# Patient Record
Sex: Female | Born: 1965 | Race: Black or African American | Hispanic: No | Marital: Married | Smoking: Never smoker
Health system: Southern US, Community
[De-identification: ages and names within clinical notes are randomized; demographics above are authoritative.]

## PROBLEM LIST (undated history)

## (undated) HISTORY — PX: FISSURECTOMY: SHX5244

## (undated) HISTORY — PX: CHOLECYSTECTOMY: SHX55

## (undated) HISTORY — PX: TUBAL LIGATION: SHX77

## (undated) HISTORY — PX: BREAST CYST EXCISION: SHX579

## (undated) HISTORY — PX: ECTOPIC PREGNANCY SURGERY: SHX613

---

## 2009-12-16 ENCOUNTER — Emergency Department (HOSPITAL_BASED_OUTPATIENT_CLINIC_OR_DEPARTMENT_OTHER): Admission: EM | Admit: 2009-12-16 | Discharge: 2009-12-16 | Payer: Self-pay | Admitting: Emergency Medicine

## 2010-02-07 ENCOUNTER — Emergency Department (HOSPITAL_BASED_OUTPATIENT_CLINIC_OR_DEPARTMENT_OTHER): Admission: EM | Admit: 2010-02-07 | Discharge: 2010-02-07 | Payer: Self-pay | Admitting: Emergency Medicine

## 2010-02-28 ENCOUNTER — Emergency Department (HOSPITAL_BASED_OUTPATIENT_CLINIC_OR_DEPARTMENT_OTHER): Admission: EM | Admit: 2010-02-28 | Discharge: 2010-02-28 | Payer: Self-pay | Admitting: Emergency Medicine

## 2010-02-28 ENCOUNTER — Ambulatory Visit: Payer: Self-pay | Admitting: Radiology

## 2010-12-19 ENCOUNTER — Emergency Department (HOSPITAL_BASED_OUTPATIENT_CLINIC_OR_DEPARTMENT_OTHER)
Admission: EM | Admit: 2010-12-19 | Discharge: 2010-12-19 | Disposition: A | Discharge status: Home / Self Care | Payer: Medicaid Other | Attending: Emergency Medicine | Admitting: Emergency Medicine

## 2010-12-19 DIAGNOSIS — J029 Acute pharyngitis, unspecified: Secondary | ICD-10-CM | POA: Insufficient documentation

## 2010-12-19 DIAGNOSIS — F17200 Nicotine dependence, unspecified, uncomplicated: Secondary | ICD-10-CM | POA: Insufficient documentation

## 2010-12-19 DIAGNOSIS — M62838 Other muscle spasm: Secondary | ICD-10-CM | POA: Insufficient documentation

## 2011-08-22 ENCOUNTER — Encounter (HOSPITAL_BASED_OUTPATIENT_CLINIC_OR_DEPARTMENT_OTHER): Payer: Self-pay | Admitting: *Deleted

## 2011-08-22 ENCOUNTER — Emergency Department (HOSPITAL_BASED_OUTPATIENT_CLINIC_OR_DEPARTMENT_OTHER)
Admission: EM | Admit: 2011-08-22 | Discharge: 2011-08-22 | Disposition: A | Discharge status: Home / Self Care | Payer: Self-pay | Attending: Emergency Medicine | Admitting: Emergency Medicine

## 2011-08-22 DIAGNOSIS — Y93G1 Activity, food preparation and clean up: Secondary | ICD-10-CM | POA: Insufficient documentation

## 2011-08-22 DIAGNOSIS — W268XXA Contact with other sharp object(s), not elsewhere classified, initial encounter: Secondary | ICD-10-CM | POA: Insufficient documentation

## 2011-08-22 DIAGNOSIS — S61209A Unspecified open wound of unspecified finger without damage to nail, initial encounter: Secondary | ICD-10-CM | POA: Insufficient documentation

## 2011-08-22 MED ORDER — TETANUS-DIPHTH-ACELL PERTUSSIS 5-2.5-18.5 LF-MCG/0.5 IM SUSP
0.5000 mL | Freq: Once | INTRAMUSCULAR | Status: AC
  Administered 2011-08-22: 0.5 mL via INTRAMUSCULAR
  Filled 2011-08-22: qty 0.5

## 2011-08-22 MED ORDER — OXYCODONE-ACETAMINOPHEN 5-325 MG PO TABS
2.0000 | ORAL_TABLET | Freq: Once | ORAL | Status: AC
  Administered 2011-08-22: 2 via ORAL
  Filled 2011-08-22: qty 2

## 2011-08-22 MED ORDER — OXYCODONE-ACETAMINOPHEN 5-325 MG PO TABS: 1.0000 | ORAL_TABLET | ORAL | Status: AC | PRN

## 2011-08-22 NOTE — ED Provider Notes (Signed)
History     CSN: [id number]  Arrival date & time 08/22/11  1020   First MD Initiated Contact with Patient 08/22/11 1044      Chief Complaint  Patient presents with  . Laceration    right fifth finger    (Consider location/radiation/quality/duration/timing/severity/associated sxs/prior treatment) HPI  Patient injured her right pinky finger last night with a potato peeler. She's continued to have some bleeding in this area. It is painful. The nail is intact. Her last tetanus shot was approximately 10 years ago.  History reviewed. No pertinent past medical history.  Past Surgical History  Procedure Date  . Tubal ligation   . Fissurectomy   . Breast cyst excision   . Ectopic pregnancy surgery   . Cholecystectomy     No family history on file.  History  Substance Use Topics  . Smoking status: Never Smoker   . Smokeless tobacco: Not on file  . Alcohol Use: Yes     occassionally    OB History    Grav Para Term Preterm Abortions TAB SAB Ect Mult Living                  Review of Systems  All other systems reviewed and are negative.    Allergies  Review of patient's allergies indicates no known allergies.  Home Medications  No current outpatient prescriptions on file.  BP 99/72  Pulse 91  Temp(Src) 97.8 F (36.6 C) (Oral)  Resp 18  Ht 5\' 6"  (1.676 m)  Wt 187 lb (84.823 kg)  BMI 30.18 kg/m2  SpO2 96%  LMP 08/12/2011  Physical Exam  Nursing note and vitals reviewed. Constitutional: She is oriented to person, place, and time. She appears well-developed and well-nourished.  HENT:  Head: Normocephalic and atraumatic.  Eyes: Pupils are equal, round, and reactive to light.  Neck: Thyromegaly present.  Musculoskeletal:       Hands:      Sensation intact. Full active range of motion. Nail intact.  Neurological: She is alert and oriented to person, place, and time.  Skin: Skin is warm.  Psychiatric: She has a normal mood and affect.    ED Course    Procedures (including critical care time)  Labs Reviewed - No data to display No results found.   No diagnosis found.    MDM  Patient is having wound cleansed       Hilario Quarry, MD 08/23/11 1243

## 2011-08-22 NOTE — Discharge Instructions (Signed)
Fingertip Laceration  The treatment of fingertip injuries depends on how large the cut is and whether the bone or nail tissue has been damaged. Amputations of the skin over the tip of the finger that is smaller than a dime (smaller than 1cm) will usually heal very well from the sides without any treatment other than cleaning the wound and changing the dressing.  Keep your hand elevated for the next 2 to 3 days to reduce pain and swelling. A splint over the fingertip may be needed to protect your injury. If your cut is being allowed to heal in from the sides, you should soak it in warm water and change the dressing daily.   You may need a tetanus shot if:  · You cannot remember when you had your last tetanus shot.   · You have never had a tetanus shot.   · The injury broke your skin.   If you got a tetanus shot, your arm may swell, get red, and feel warm to the touch. This is common and not a problem. If you need a tetanus shot and you choose not to have one, there is a rare chance of getting tetanus. Sickness from tetanus can be serious.  SEEK MEDICAL CARE IF:   · There are any signs of infection: increased redness, swelling, and pain, or sometimes pus drainage.   Document Released: 07/26/2004 Document Revised: 02/28/2011 Document Reviewed: 07/22/2008  Exit[employer]® Patient Information ©2012 [employer].

## 2011-08-22 NOTE — ED Notes (Signed)
Patient states she was cutting fries last night and sliced the pad off of her right fifth finger.

## 2011-10-07 ENCOUNTER — Emergency Department (HOSPITAL_BASED_OUTPATIENT_CLINIC_OR_DEPARTMENT_OTHER)
Admission: EM | Admit: 2011-10-07 | Discharge: 2011-10-07 | Disposition: A | Discharge status: Home / Self Care | Payer: Self-pay | Attending: Emergency Medicine | Admitting: Emergency Medicine

## 2011-10-07 ENCOUNTER — Encounter (HOSPITAL_BASED_OUTPATIENT_CLINIC_OR_DEPARTMENT_OTHER): Payer: Self-pay | Admitting: *Deleted

## 2011-10-07 DIAGNOSIS — J020 Streptococcal pharyngitis: Secondary | ICD-10-CM | POA: Insufficient documentation

## 2011-10-07 MED ORDER — LIDOCAINE HCL (PF) 1 % IJ SOLN
INTRAMUSCULAR | Status: AC
  Filled 2011-10-07: qty 5

## 2011-10-07 MED ORDER — PENICILLIN G BENZATHINE 1200000 UNIT/2ML IM SUSP
1.2000 10*6.[IU] | Freq: Once | INTRAMUSCULAR | Status: AC
  Administered 2011-10-07: 1.2 10*6.[IU] via INTRAMUSCULAR
  Filled 2011-10-07: qty 2

## 2011-10-07 MED ORDER — CEFTRIAXONE SODIUM 1 G IJ SOLR
1.0000 g | Freq: Once | INTRAMUSCULAR | Status: AC
  Administered 2011-10-07: 1 g via INTRAMUSCULAR

## 2011-10-07 MED ORDER — CEFTRIAXONE SODIUM 1 G IJ SOLR
1.0000 g | Freq: Once | INTRAMUSCULAR | Status: DC
  Filled 2011-10-07: qty 10

## 2011-10-07 MED ORDER — PENICILLIN V POTASSIUM 500 MG PO TABS: 500.0000 mg | ORAL_TABLET | Freq: Four times a day (QID) | ORAL | Status: DC

## 2011-10-07 MED ORDER — METHYLPREDNISOLONE SODIUM SUCC 125 MG IJ SOLR
125.0000 mg | Freq: Once | INTRAMUSCULAR | Status: AC
  Administered 2011-10-07: 125 mg via INTRAMUSCULAR
  Filled 2011-10-07: qty 2

## 2011-10-07 MED ORDER — HYDROCODONE-ACETAMINOPHEN 7.5-500 MG/15ML PO SOLN: 15.0000 mL | Freq: Four times a day (QID) | ORAL | Status: AC | PRN

## 2011-10-07 MED ORDER — CEFTRIAXONE SODIUM 250 MG IJ SOLR: 250.0000 mg | Freq: Once | INTRAMUSCULAR | Status: DC

## 2011-10-07 NOTE — ED Provider Notes (Signed)
Medical screening examination/treatment/procedure(s) were performed by non-physician practitioner and as supervising physician I was immediately available for consultation/collaboration.   Jyasia Markoff, MD 10/07/11 1514 

## 2011-10-07 NOTE — ED Notes (Signed)
Sore throat/ear ache since last week, hurts to swallow, some chills at night

## 2011-10-07 NOTE — Discharge Instructions (Signed)
Salt Water Gargle  This solution will help make your mouth and throat feel better.  HOME CARE INSTRUCTIONS     Mix 1 teaspoon of salt in 8 ounces of warm water.   Gargle with this solution as much or often as you need or as directed. Swish and gargle gently if you have any sores or wounds in your mouth.   Do not swallow this mixture.  Document Released: 03/22/2004 Document Revised: 06/07/2011 Document Reviewed: 08/13/2008  [employer] Patient Information 2012 [employer].    Strep Throat  Strep throat is an infection of the throat caused by a bacteria named Streptococcus pyogenes. Your caregiver may call the infection streptococcal "tonsillitis" or "pharyngitis" depending on whether there are signs of inflammation in the tonsils or back of the throat. Strep throat is most common in children from 5 to 15 years old during the cold months of the year, but it can occur in people of any age during any season. This infection is spread from person to person (contagious) through coughing, sneezing, or other close contact.  SYMPTOMS     Fever or chills.   Painful, swollen, red tonsils or throat.   Pain or difficulty when swallowing.   White or yellow spots on the tonsils or throat.   Swollen, tender lymph nodes or "glands" of the neck or under the jaw.   Red rash all over the body (rare).  DIAGNOSIS    Many different infections can cause the same symptoms. A test must be done to confirm the diagnosis so the right treatment can be given. A "rapid strep test" can help your caregiver make the diagnosis in a few minutes. If this test is not available, a light swab of the infected area can be used for a throat culture test. If a throat culture test is done, results are usually available in a day or two.  TREATMENT    Strep throat is treated with antibiotic medicine.  HOME CARE INSTRUCTIONS     Gargle with 1 tsp of salt in 1 cup of warm water, 3 to 4 times per day or as needed for comfort.    Family members who also have a sore throat or fever should be tested for strep throat and treated with antibiotics if they have the strep infection.   Make sure everyone in your household washes their hands well.   Do not share food, drinking cups, or personal items that could cause the infection to spread to others.   You may need to eat a soft food diet until your sore throat gets better.   Drink enough water and fluids to keep your urine clear or pale yellow. This will help prevent dehydration.   Get plenty of rest.   Stay home from school, daycare, or work until you have been on antibiotics for 24 hours.   Only take over-the-counter or prescription medicines for pain, discomfort, or fever as directed by your caregiver.   If antibiotics are prescribed, take them as directed. Finish them even if you start to feel better.  SEEK MEDICAL CARE IF:     The glands in your neck continue to enlarge.   You develop a rash, cough, or earache.   You cough up green, yellow-brown, or bloody sputum.   You have pain or discomfort not controlled by medicines.   Your problems seem to be getting worse rather than better.  SEEK IMMEDIATE MEDICAL CARE IF:     You develop any new symptoms such   as vomiting, severe headache, stiff or painful neck, chest pain, shortness of breath, or trouble swallowing.   You develop severe throat pain, drooling, or changes in your voice.   You develop swelling of the neck, or the skin on the neck becomes red and tender.   You have a fever.   You develop signs of dehydration, such as fatigue, dry mouth, and decreased urination.   You become increasingly sleepy, or you cannot wake up completely.  Document Released: 06/15/2000 Document Revised: 06/07/2011 Document Reviewed: 08/17/2010  [employer] Patient Information 2012 [employer].

## 2011-10-07 NOTE — ED Provider Notes (Signed)
History     CSN: [id number]  Arrival date & time 10/07/11  1153   First MD Initiated Contact with Patient 10/07/11 1208      Chief Complaint  Patient presents with  . Sore Throat    (Consider location/radiation/quality/duration/timing/severity/associated sxs/prior treatment) HPI  Patient presents to the ED with complaints of sore throat that started 1 week ago. She states that she had a bad throat infection 4 months ago and this feels a lot like that did. She also admits to bilateral ear pain. She says that the left side hurts worse than the right. She is able to swallow with pain. She is tolerating her secretions, her voices does not sound muffled and she has not had any difficulty breathing or weakness.   History reviewed. No pertinent past medical history.  Past Surgical History  Procedure Date  . Tubal ligation   . Fissurectomy   . Breast cyst excision   . Ectopic pregnancy surgery   . Cholecystectomy     No family history on file.  History  Substance Use Topics  . Smoking status: Never Smoker   . Smokeless tobacco: Not on file  . Alcohol Use: Yes     occassionally    OB History    Grav Para Term Preterm Abortions TAB SAB Ect Mult Living                  Review of Systems  All other systems reviewed and are negative.    Allergies  Review of patient's allergies indicates no known allergies.  Home Medications   Current Outpatient Rx  Name Route Sig Dispense Refill  . HYDROCODONE-ACETAMINOPHEN 7.5-500 MG/15ML PO SOLN Oral Take 15 mLs by mouth every 6 (six) hours as needed for pain. 120 mL 0  . PENICILLIN V POTASSIUM 500 MG PO TABS Oral Take 1 tablet (500 mg total) by mouth 4 (four) times daily. 40 tablet 0    BP 139/83  Pulse 96  Temp(Src) 98.7 F (37.1 C) (Oral)  Resp 20  SpO2 100%  Physical Exam  Nursing note and vitals reviewed. Constitutional: She appears well-developed and well-nourished. No distress.  HENT:  Head: Normocephalic and  atraumatic. No trismus in the jaw.  Right Ear: Hearing and tympanic membrane normal.  Left Ear: Hearing and tympanic membrane normal.  Nose: No rhinorrhea.  Mouth/Throat: Mucous membranes are normal. No uvula swelling. Oropharyngeal exudate and posterior oropharyngeal erythema present. No tonsillar abscesses.  Eyes: Pupils are equal, round, and reactive to light.  Neck: Normal range of motion. Neck supple.  Cardiovascular: Normal rate and regular rhythm.   Pulmonary/Chest: Effort normal.  Abdominal: Soft.  Neurological: She is alert.  Skin: Skin is warm and dry.    ED Course  Procedures (including critical care time)   Labs Reviewed  RAPID STREP SCREEN   No results found.   1. Strep pharyngitis       MDM  Pt given IM Bicillin and Solumedrol in ED. Also given Rx for Lortab elixir.   Pt referred to Dr.Rosen ENT for follow- up.  Pt has been advised of the symptoms that warrant their return to the ED. Patient has voiced understanding and has agreed to follow-up with the PCP or specialist.         Dorthula Matas, PA 10/07/11 1229  Dorthula Matas, PA 10/07/11 1251

## 2013-01-15 ENCOUNTER — Emergency Department (HOSPITAL_BASED_OUTPATIENT_CLINIC_OR_DEPARTMENT_OTHER)
Admission: EM | Admit: 2013-01-15 | Discharge: 2013-01-15 | Disposition: A | Discharge status: Home / Self Care | Payer: Self-pay | Attending: Emergency Medicine | Admitting: Emergency Medicine

## 2013-01-15 ENCOUNTER — Ambulatory Visit (HOSPITAL_BASED_OUTPATIENT_CLINIC_OR_DEPARTMENT_OTHER)
Admission: RE | Admit: 2013-01-15 | Discharge: 2013-01-15 | Disposition: A | Discharge status: Home / Self Care | Payer: Self-pay | Source: Ambulatory Visit | Attending: Emergency Medicine | Admitting: Emergency Medicine

## 2013-01-15 ENCOUNTER — Other Ambulatory Visit (HOSPITAL_BASED_OUTPATIENT_CLINIC_OR_DEPARTMENT_OTHER): Payer: Self-pay | Admitting: Emergency Medicine

## 2013-01-15 ENCOUNTER — Telehealth (HOSPITAL_BASED_OUTPATIENT_CLINIC_OR_DEPARTMENT_OTHER): Payer: Self-pay | Admitting: Physician Assistant

## 2013-01-15 ENCOUNTER — Encounter (HOSPITAL_BASED_OUTPATIENT_CLINIC_OR_DEPARTMENT_OTHER): Payer: Self-pay | Admitting: *Deleted

## 2013-01-15 DIAGNOSIS — Z9889 Other specified postprocedural states: Secondary | ICD-10-CM | POA: Insufficient documentation

## 2013-01-15 DIAGNOSIS — R110 Nausea: Secondary | ICD-10-CM | POA: Insufficient documentation

## 2013-01-15 DIAGNOSIS — N949 Unspecified condition associated with female genital organs and menstrual cycle: Secondary | ICD-10-CM | POA: Insufficient documentation

## 2013-01-15 DIAGNOSIS — R102 Pelvic and perineal pain: Secondary | ICD-10-CM

## 2013-01-15 DIAGNOSIS — D250 Submucous leiomyoma of uterus: Secondary | ICD-10-CM | POA: Insufficient documentation

## 2013-01-15 DIAGNOSIS — Z9089 Acquired absence of other organs: Secondary | ICD-10-CM | POA: Insufficient documentation

## 2013-01-15 DIAGNOSIS — Z3202 Encounter for pregnancy test, result negative: Secondary | ICD-10-CM | POA: Insufficient documentation

## 2013-01-15 DIAGNOSIS — Z9851 Tubal ligation status: Secondary | ICD-10-CM | POA: Insufficient documentation

## 2013-01-15 DIAGNOSIS — N83209 Unspecified ovarian cyst, unspecified side: Secondary | ICD-10-CM | POA: Insufficient documentation

## 2013-01-15 DIAGNOSIS — N940 Mittelschmerz: Secondary | ICD-10-CM

## 2013-01-15 LAB — WET PREP, GENITAL
Trich, Wet Prep: NONE SEEN
Yeast Wet Prep HPF POC: NONE SEEN

## 2013-01-15 LAB — GC/CHLAMYDIA PROBE AMP
CT Probe RNA: NEGATIVE
GC Probe RNA: NEGATIVE

## 2013-01-15 LAB — URINALYSIS, ROUTINE W REFLEX MICROSCOPIC
Bilirubin Urine: NEGATIVE
Glucose, UA: NEGATIVE mg/dL
Hgb urine dipstick: NEGATIVE
Leukocytes, UA: NEGATIVE
Nitrite: NEGATIVE

## 2013-01-15 MED ORDER — HYDROCODONE-ACETAMINOPHEN 5-325 MG PO TABS: 1.0000 | ORAL_TABLET | Freq: Four times a day (QID) | ORAL | Status: DC | PRN

## 2013-01-15 MED ORDER — NAPROXEN 250 MG PO TABS
ORAL_TABLET | ORAL | Status: AC
  Filled 2013-01-15: qty 2

## 2013-01-15 MED ORDER — ONDANSETRON 8 MG PO TBDP: 8.0000 mg | ORAL_TABLET | Freq: Three times a day (TID) | ORAL | Status: DC | PRN

## 2013-01-15 MED ORDER — NAPROXEN 250 MG PO TABS
500.0000 mg | ORAL_TABLET | Freq: Once | ORAL | Status: AC
  Administered 2013-01-15: 500 mg via ORAL
  Filled 2013-01-15: qty 1

## 2013-01-15 MED ORDER — HYDROCODONE-ACETAMINOPHEN 5-325 MG PO TABS
1.0000 | ORAL_TABLET | Freq: Once | ORAL | Status: AC
  Administered 2013-01-15: 1 via ORAL
  Filled 2013-01-15: qty 1

## 2013-01-15 MED ORDER — ONDANSETRON 4 MG PO TBDP
4.0000 mg | ORAL_TABLET | Freq: Once | ORAL | Status: AC
  Administered 2013-01-15: 4 mg via ORAL
  Filled 2013-01-15: qty 1

## 2013-01-15 NOTE — ED Provider Notes (Signed)
History    CSN: [id number] Arrival date & time 01/15/13  0005  First MD Initiated Contact with Patient 01/15/13 0148     Chief Complaint  Patient presents with  . Abdominal Pain   (Consider location/radiation/quality/duration/timing/severity/associated sxs/prior Treatment) HPI 47 year old female who was having sexual intercourse about 11 PM yesterday when she developed the sudden onset of a sharp, crampy, severe pain in her suprapubic region. There was associated nausea but no vomiting. She denies fever, chills, dysuria, hematuria, vaginal bleeding or vaginal discharge. The pain is improved somewhat and is now described as an 8-1/2 out of 10. The pain is worse with movement or palpation. She is about halfway through her menstrual cycle.  History reviewed. No pertinent past medical history. Past Surgical History  Procedure Laterality Date  . Tubal ligation    . Fissurectomy    . Breast cyst excision    . Ectopic pregnancy surgery    . Cholecystectomy     History reviewed. No pertinent family history. History  Substance Use Topics  . Smoking status: Never Smoker   . Smokeless tobacco: Not on file  . Alcohol Use: No     Comment: occassionally   OB History   Grav Para Term Preterm Abortions TAB SAB Ect Mult Living                 Review of Systems  All other systems reviewed and are negative.    Allergies  Review of patient's allergies indicates no known allergies.  Home Medications  No current outpatient prescriptions on file. BP 107/59  Pulse 71  Temp(Src) 98.1 F (36.7 C) (Oral)  Resp 16  Ht 5\' 6"  (1.676 m)  Wt 183 lb (83.008 kg)  BMI 29.55 kg/m2  SpO2 100%  LMP 12/30/2012  Physical Exam General: Well-developed, well-nourished female in no acute distress; appearance consistent with age of record HENT: normocephalic, atraumatic Eyes: pupils equal round and reactive to light; extraocular muscles intact Neck: supple Heart: regular rate and rhythm Lungs:  clear to auscultation bilaterally Abdomen: soft; nondistended; mild diffuse tenderness with moderate suprapubic tenderness; no masses or hepatosplenomegaly; bowel sounds present GU: Normal external genitalia; vaginal bleeding; physiologic appearing vaginal discharge; cervical motion tenderness pain felt anteriorly; mild bilateral adnexal tenderness Extremities: No deformity; full range of motion; pulses normal Neurologic: Awake, alert and oriented; motor function intact in all extremities and symmetric; no facial droop Skin: Warm and dry Psychiatric: Normal mood and affect    ED Course  Procedures (including critical care time)   MDM   Nursing notes and vitals signs, including pulse oximetry, reviewed.  Summary of this visit's results, reviewed by myself:  Labs:  Results for orders placed during the hospital encounter of 01/15/13 (from the past 24 hour(s))  URINALYSIS, ROUTINE W REFLEX MICROSCOPIC     Status: Abnormal   Collection Time    01/15/13 12:29 AM      Result Value Range   Color, Urine YELLOW  YELLOW   APPearance CLOUDY (*) CLEAR   Specific Gravity, Urine 1.024  1.005 - 1.030   pH 6.0  5.0 - 8.0   Glucose, UA NEGATIVE  NEGATIVE mg/dL   Hgb urine dipstick NEGATIVE  NEGATIVE   Bilirubin Urine NEGATIVE  NEGATIVE   Ketones, ur NEGATIVE  NEGATIVE mg/dL   Protein, ur NEGATIVE  NEGATIVE mg/dL   Urobilinogen, UA 1.0  0.0 - 1.0 mg/dL   Nitrite NEGATIVE  NEGATIVE   Leukocytes, UA NEGATIVE  NEGATIVE  PREGNANCY, URINE  Status: None   Collection Time    01/15/13 12:29 AM      Result Value Range   Preg Test, Ur NEGATIVE  NEGATIVE  WET PREP, GENITAL     Status: Abnormal   Collection Time    01/15/13  2:10 AM      Result Value Range   Yeast Wet Prep HPF POC NONE SEEN  NONE SEEN   Trich, Wet Prep NONE SEEN  NONE SEEN   Clue Cells Wet Prep HPF POC FEW (*) NONE SEEN   WBC, Wet Prep HPF POC MODERATE (*) NONE SEEN   2:29 AM Given sudden onset of pain and maximum severity  of onset this likely represents mittelschmerz or ruptured ovarian cyst. We will treat her pain and have her return for ultrasound later today.   Carlisle Beers Axten Pascucci, MD 01/15/13 0230

## 2013-01-15 NOTE — ED Notes (Signed)
MD at bedside. 

## 2013-01-15 NOTE — ED Notes (Signed)
Pt reported to triage nurse that "it hurts down into my vagina"

## 2013-01-15 NOTE — ED Notes (Addendum)
Pt c/o lower abd pain radiates  Into vagina , x 1 hr ago, nausea only

## 2013-02-11 ENCOUNTER — Encounter: Payer: Medicaid Other | Admitting: Obstetrics & Gynecology

## 2013-04-05 ENCOUNTER — Encounter (HOSPITAL_BASED_OUTPATIENT_CLINIC_OR_DEPARTMENT_OTHER): Payer: Self-pay | Admitting: Emergency Medicine

## 2013-04-05 ENCOUNTER — Emergency Department (HOSPITAL_BASED_OUTPATIENT_CLINIC_OR_DEPARTMENT_OTHER)
Admission: EM | Admit: 2013-04-05 | Discharge: 2013-04-05 | Disposition: A | Discharge status: Home / Self Care | Payer: BC Managed Care – PPO | Attending: Emergency Medicine | Admitting: Emergency Medicine

## 2013-04-05 DIAGNOSIS — Z9851 Tubal ligation status: Secondary | ICD-10-CM | POA: Insufficient documentation

## 2013-04-05 DIAGNOSIS — Z9889 Other specified postprocedural states: Secondary | ICD-10-CM | POA: Insufficient documentation

## 2013-04-05 DIAGNOSIS — Z3202 Encounter for pregnancy test, result negative: Secondary | ICD-10-CM | POA: Insufficient documentation

## 2013-04-05 DIAGNOSIS — K432 Incisional hernia without obstruction or gangrene: Secondary | ICD-10-CM | POA: Insufficient documentation

## 2013-04-05 LAB — URINALYSIS, ROUTINE W REFLEX MICROSCOPIC
Ketones, ur: 15 mg/dL — AB
Leukocytes, UA: NEGATIVE
Nitrite: NEGATIVE
Protein, ur: NEGATIVE mg/dL
Specific Gravity, Urine: 1.018 (ref 1.005–1.030)
Urobilinogen, UA: 1 mg/dL (ref 0.0–1.0)

## 2013-04-05 MED ORDER — TRAMADOL HCL 50 MG PO TABS: 50.0000 mg | ORAL_TABLET | Freq: Four times a day (QID) | ORAL | Status: AC | PRN

## 2013-04-05 NOTE — ED Notes (Signed)
Upper abdominal pain since last night.  No N/V/D.  No known fever or sob.

## 2013-04-05 NOTE — ED Provider Notes (Signed)
CSN: [id number]     Arrival date & time 04/05/13  2952 History   First MD Initiated Contact with Patient 04/05/13 1002     Chief Complaint  Patient presents with  . Abdominal Pain    HPI  Simply presents with a tender "lump" in her epigastric abdomen. She noticed it yesterday. She states her back was sore she was on the floor and stretching she noticed it was tender she states when she pushes on it is point tender right paracentral epigastrium underneath her incision from her laparoscopic cholecystectomy.  She is not nauseated. She shows good appetite and that she thinks states "I'm starving". No vomiting normal bowel movements no erythematous skin  History reviewed. No pertinent past medical history. Past Surgical History  Procedure Laterality Date  . Tubal ligation    . Fissurectomy    . Breast cyst excision    . Ectopic pregnancy surgery    . Cholecystectomy     History reviewed. No pertinent family history. History  Substance Use Topics  . Smoking status: Never Smoker   . Smokeless tobacco: Not on file  . Alcohol Use: No     Comment: occassionally   OB History   Grav Para Term Preterm Abortions TAB SAB Ect Mult Living                 Review of Systems  Constitutional: Negative for fever, chills, diaphoresis, appetite change and fatigue.  HENT: Negative for sore throat, mouth sores and trouble swallowing.   Eyes: Negative for visual disturbance.  Respiratory: Negative for cough, chest tightness, shortness of breath and wheezing.   Cardiovascular: Negative for chest pain.  Gastrointestinal: Positive for abdominal pain. Negative for nausea, vomiting, diarrhea and abdominal distention.       Palpable mass in the epigastrium  Endocrine: Negative for polydipsia, polyphagia and polyuria.  Genitourinary: Negative for dysuria, frequency and hematuria.  Musculoskeletal: Negative for gait problem.  Skin: Negative for color change, pallor and rash.  Neurological: Negative for  dizziness, syncope, light-headedness and headaches.  Hematological: Does not bruise/bleed easily.  Psychiatric/Behavioral: Negative for behavioral problems and confusion.    Allergies  Review of patient's allergies indicates no known allergies.  Home Medications   Current Outpatient Rx  Name  Route  Sig  Dispense  Refill  . traMADol (ULTRAM) 50 MG tablet   Oral   Take 1 tablet (50 mg total) by mouth every 6 (six) hours as needed for pain.   15 tablet   0    BP 124/75  Pulse 65  Temp(Src) 97.6 F (36.4 C) (Oral)  Resp 16  Ht 5\' 6"  (1.676 m)  Wt 168 lb (76.204 kg)  BMI 27.13 kg/m2  SpO2 100%  LMP 03/25/2013 Physical Exam  Constitutional: She is oriented to person, place, and time. She appears well-developed and well-nourished. No distress.  HENT:  Head: Normocephalic.  Eyes: Conjunctivae are normal. Pupils are equal, round, and reactive to light. No scleral icterus.  Neck: Normal range of motion. Neck supple. No thyromegaly present.  Cardiovascular: Normal rate and regular rhythm.  Exam reveals no gallop and no friction rub.   No murmur heard. Pulmonary/Chest: Effort normal and breath sounds normal. No respiratory distress. She has no wheezes. She has no rales.  Abdominal: Soft. Bowel sounds are normal. She exhibits no distension. There is no tenderness. There is no rebound.  She has a palpable incisional hernia underlying her superior midline laparoscopic incision. It is tender. It does reduce. She  states somewhat sore afterwards. Her abdomen is otherwise soft and benign. She is on arrival quadrant tenderness patient normal active bowel sounds  Musculoskeletal: Normal range of motion.  Neurological: She is alert and oriented to person, place, and time.  Skin: Skin is warm and dry. No rash noted.  Psychiatric: She has a normal mood and affect. Her behavior is normal.    ED Course  Procedures (including critical care time) Labs Review Labs Reviewed  URINALYSIS, ROUTINE  W REFLEX MICROSCOPIC - Abnormal; Notable for the following:    Ketones, ur 15 (*)    All other components within normal limits  PREGNANCY, URINE   Imaging Review No results found.  MDM   1. Incisional hernia    She shows no sign of obstruction. It is minimally tender. Planned surgical referral for outpatient herniorrhaphy. Ultram as needed for pain. Avoid heavy lifting. Attempt reduction with recurrence.    Roney Marion, MD 04/05/13 1009

## 2018-12-18 ENCOUNTER — Encounter (HOSPITAL_BASED_OUTPATIENT_CLINIC_OR_DEPARTMENT_OTHER): Payer: Self-pay

## 2018-12-18 ENCOUNTER — Emergency Department (HOSPITAL_BASED_OUTPATIENT_CLINIC_OR_DEPARTMENT_OTHER): Payer: Self-pay

## 2018-12-18 ENCOUNTER — Other Ambulatory Visit: Payer: Self-pay

## 2018-12-18 ENCOUNTER — Emergency Department (HOSPITAL_BASED_OUTPATIENT_CLINIC_OR_DEPARTMENT_OTHER)
Admission: EM | Admit: 2018-12-18 | Discharge: 2018-12-18 | Disposition: A | Discharge status: Home / Self Care | Payer: Self-pay | Attending: Emergency Medicine | Admitting: Emergency Medicine

## 2018-12-18 DIAGNOSIS — M25561 Pain in right knee: Secondary | ICD-10-CM | POA: Insufficient documentation

## 2018-12-18 MED ORDER — NAPROXEN 500 MG PO TABS: 500.0000 mg | ORAL_TABLET | Freq: Two times a day (BID) | ORAL | 0 refills | Status: AC | PRN

## 2018-12-18 NOTE — ED Triage Notes (Signed)
Pt c/o pain to right knee area x 6 days-started after a workout and feels knee cap is moving-pt is wearing a knee brace-NAD-limping gait

## 2018-12-18 NOTE — Discharge Instructions (Signed)
It was my pleasure taking care of you today!   Naproxen as needed for pain. Rest, ice, elevate the leg.   Call the sports medicine doctor listed tomorrow morning to schedule a follow up appointment.   Return to ER for new or worsening symptoms, any additional concerns.

## 2018-12-18 NOTE — ED Provider Notes (Signed)
[facility] EMERGENCY DEPARTMENT Provider Note   CSN: [id number] Arrival date & time: 12/18/18  1533    History   Chief Complaint Chief Complaint  Patient presents with  . Knee Pain    HPI Regina Spencer is a 53 y.o. female.     The history is provided by the patient and medical records. No language interpreter was used.  Knee Pain  Regina Spencer is a 53 y.o. female who presents to the Emergency Department complaining of right knee pain.  Patient states that she has been having intermittent aches and pains to the right knee for quite some time now.  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  She really did not think much of it.  6 days ago, after a workout, she noticed a sharp pain to the upper lateral aspect of her knee.  With certain movements, she felt as if her kneecap was shifting out of place laterally.  She would push it back in.  She really does not have much pain unless the kneecap is shifting out of place.  She denies any previous injuries to this knee in the past.  She did have some swelling which is very much improved over the last few days.  She has been wearing a knee brace at times and an Ace wrap at others.  No numbness or weakness.  She states that when the kneecap does feel out of place, she is unable to bend her knee at all, but if she shifts it back and then she can bend it fine.  She will occasionally feel a pop when she does this.  History reviewed. No pertinent past medical history.  There are no active problems to display for this patient.   Past Surgical History:  Procedure Laterality Date  . BREAST CYST EXCISION    . CHOLECYSTECTOMY    . ECTOPIC PREGNANCY SURGERY    . FISSURECTOMY    . TUBAL LIGATION       OB History   No obstetric history on file.      Home Medications    Prior to Admission medications   Medication Sig Start Date End Date Taking? Authorizing Provider  naproxen (NAPROSYN) 500 MG tablet Take 1  tablet (500 mg total) by mouth 2 (two) times daily as needed for mild pain or moderate pain. 12/18/18   Wanita Derenzo, Ozella Almond, PA-C  traMADol (ULTRAM) 50 MG tablet Take 1 tablet (50 mg total) by mouth every 6 (six) hours as needed for pain. 04/05/13   Tanna Furry, MD    Family History No family history on file.  Social History Social History   Tobacco Use  . Smoking status: Never Smoker  . Smokeless tobacco: Never Used  Substance Use Topics  . Alcohol use: Yes    Comment: occ  . Drug use: No     Allergies   Patient has no known allergies.   Review of Systems Review of Systems  Musculoskeletal: Positive for arthralgias and myalgias.  Skin: Negative for color change and wound.  Neurological: Negative for weakness and numbness.     Physical Exam Updated Vital Signs BP 118/75 (BP Location: Left Arm)   Pulse 61   Temp 98.1 F (36.7 C) (Oral)   Resp 16   Ht 5\' 5"  (1.651 m)   Wt 72.6 kg   SpO2 100%   BMI 26.63 kg/m   Physical Exam Vitals signs and nursing note reviewed.  Constitutional:  General: She is not in acute distress.    Appearance: She is well-developed.  HENT:     Head: Normocephalic and atraumatic.  Neck:     Musculoskeletal: Neck supple.  Cardiovascular:     Rate and Rhythm: Normal rate and regular rhythm.     Heart sounds: Normal heart sounds. No murmur.  Pulmonary:     Effort: Pulmonary effort is normal. No respiratory distress.     Breath sounds: Normal breath sounds. No wheezing or rales.  Musculoskeletal:     Comments: Tenderness to the right anterolateral knee. Has full ROM of the knee, but significant amount of crepitus noted to anterior lateral knee with flexion. Negative drawer's, Lachman's. 2+ DP pulses bilaterally. All compartments are soft. Sensation intact distal to injury.  Skin:    General: Skin is warm and dry.  Neurological:     Mental Status: She is alert.      ED Treatments / Results  Labs (all labs ordered are listed,  but only abnormal results are displayed) Labs Reviewed - No data to display  EKG None  Radiology Dg Knee Complete 4 Views Right  Result Date: 12/18/2018 CLINICAL DATA:  Pt states lateral condyle on knee popped out with different movements x today. She had to reduce herself but has experienced this several times before. EXAM: RIGHT KNEE - COMPLETE 4+ VIEW COMPARISON:  None. FINDINGS: No evidence of fracture, dislocation, or joint effusion. 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 IMPRESSION: No acute osseous injury of the right knee. Electronically Signed   By: Kathreen Devoid   On: 12/18/2018 17:15    Procedures Procedures (including critical care time)  Medications Ordered in ED Medications - No data to display   Initial Impression / Assessment and Plan / ED Course  I have reviewed the triage vital signs and the nursing notes.  Pertinent labs & imaging results that were available during my care of the patient were reviewed by me and considered in my medical decision making (see chart for details).       Regina Spencer is a 53 y.o. female who presents to ED for right knee pain for the last several days.  Neurovascularly intact on exam.  Does have significant amount of crepitus to the anterior lateral aspect of her knee.  Has good range of motion and patellar mobility for me.  She does report acute onset of pain when she feels as if her patella is shifting laterally and will be unable to flex the knee.  When she shifts the kneecap back, range of motion improved some pain improves as well.  Suspect she is having subluxation of the kneecap.  X-ray today shows no acute abnormalities, but she does have tricompartmental arthritis which could be contributing.  Will place her in a knee immobilizer and have her follow-up with orthopedics.  Home care instructions, follow-up plan and return cautions were discussed and all questions answered.  Patient  discussed with Dr. Ronnald Nian who agrees with treatment plan.    Final Clinical Impressions(s) / ED Diagnoses   Final diagnoses:  Acute pain of right knee    ED Discharge Orders         Ordered    naproxen (NAPROSYN) 500 MG tablet  2 times daily PRN     12/18/18 1732           Joey Lierman, Ozella Almond, PA-C 12/18/18 [place], McConnell, DO 12/18/18 1930

## 2020-01-04 ENCOUNTER — Emergency Department (HOSPITAL_BASED_OUTPATIENT_CLINIC_OR_DEPARTMENT_OTHER): Payer: Self-pay

## 2020-01-04 ENCOUNTER — Emergency Department (HOSPITAL_BASED_OUTPATIENT_CLINIC_OR_DEPARTMENT_OTHER)
Admission: EM | Admit: 2020-01-04 | Discharge: 2020-01-04 | Disposition: A | Discharge status: Home / Self Care | Payer: Self-pay | Attending: Emergency Medicine | Admitting: Emergency Medicine

## 2020-01-04 ENCOUNTER — Encounter (HOSPITAL_BASED_OUTPATIENT_CLINIC_OR_DEPARTMENT_OTHER): Payer: Self-pay | Admitting: *Deleted

## 2020-01-04 ENCOUNTER — Other Ambulatory Visit: Payer: Self-pay

## 2020-01-04 DIAGNOSIS — S161XXA Strain of muscle, fascia and tendon at neck level, initial encounter: Secondary | ICD-10-CM | POA: Insufficient documentation

## 2020-01-04 DIAGNOSIS — Y999 Unspecified external cause status: Secondary | ICD-10-CM | POA: Insufficient documentation

## 2020-01-04 DIAGNOSIS — Y939 Activity, unspecified: Secondary | ICD-10-CM | POA: Insufficient documentation

## 2020-01-04 DIAGNOSIS — Y929 Unspecified place or not applicable: Secondary | ICD-10-CM | POA: Insufficient documentation

## 2020-01-04 DIAGNOSIS — R519 Headache, unspecified: Secondary | ICD-10-CM | POA: Insufficient documentation

## 2020-01-04 DIAGNOSIS — X509XXA Other and unspecified overexertion or strenuous movements or postures, initial encounter: Secondary | ICD-10-CM | POA: Insufficient documentation

## 2020-01-04 MED ORDER — CYCLOBENZAPRINE HCL 10 MG PO TABS
10.0000 mg | ORAL_TABLET | Freq: Two times a day (BID) | ORAL | 0 refills | Status: AC | PRN
Start: 2020-01-04 — End: ?

## 2020-01-04 MED ORDER — PREDNISONE 20 MG PO TABS: ORAL_TABLET | ORAL | 0 refills | Status: AC

## 2020-01-04 MED ORDER — KETOROLAC TROMETHAMINE 60 MG/2ML IM SOLN
60.0000 mg | Freq: Once | INTRAMUSCULAR | Status: AC
  Administered 2020-01-04: 60 mg via INTRAMUSCULAR
  Filled 2020-01-04: qty 2
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
## 2020-10-25 ENCOUNTER — Other Ambulatory Visit: Payer: Self-pay

## 2020-10-25 ENCOUNTER — Encounter (HOSPITAL_BASED_OUTPATIENT_CLINIC_OR_DEPARTMENT_OTHER): Payer: Self-pay

## 2020-10-25 ENCOUNTER — Emergency Department (HOSPITAL_BASED_OUTPATIENT_CLINIC_OR_DEPARTMENT_OTHER): Payer: Worker's Compensation

## 2020-10-25 ENCOUNTER — Emergency Department (HOSPITAL_BASED_OUTPATIENT_CLINIC_OR_DEPARTMENT_OTHER)
Admission: EM | Admit: 2020-10-25 | Discharge: 2020-10-25 | Disposition: A | Discharge status: Home / Self Care | Payer: Worker's Compensation | Attending: Emergency Medicine | Admitting: Emergency Medicine

## 2020-10-25 DIAGNOSIS — M25561 Pain in right knee: Secondary | ICD-10-CM | POA: Diagnosis not present

## 2020-10-25 DIAGNOSIS — X58XXXA Exposure to other specified factors, initial encounter: Secondary | ICD-10-CM | POA: Diagnosis not present

## 2020-10-25 DIAGNOSIS — Y990 Civilian activity done for income or pay: Secondary | ICD-10-CM | POA: Insufficient documentation

## 2020-10-25 MED ORDER — DICLOFENAC SODIUM 1 % EX GEL: 2.0000 g | Freq: Four times a day (QID) | CUTANEOUS | 0 refills | Status: AC | PRN

## 2020-10-25 NOTE — Discharge Instructions (Signed)
You were seen in the emergency room today with knee pain.  Your x-ray did not show any fracture but you do have degenerative changes in the knee which are more chronic.  I have attached the contact information for sports medicine doctor who can help with further evaluation and treatment of your knee.  You may take Tylenol and/or ibuprofen as needed as well as ice and compression over the next several days.   Return to the emergency department any new or suddenly worsening symptoms.

## 2020-10-25 NOTE — ED Provider Notes (Signed)
Emergency Department Provider Note   I have reviewed the triage vital signs and the nursing notes.   HISTORY  Chief Complaint Knee Injury   HPI Regina Spencer is a 55 y.o. female with past medical history reviewed below presents to the emergency department for evaluation of right knee pain after injury while at work yesterday.  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  She had soreness to touch and felt like her kneecap was pushed to the side.  She had trouble bending her leg.  She pushed the kneecap back to the middle and pain improved but continues to have some soreness and feeling of instability.  She wore a knee sleeve given to her husband previously and that has improved symptoms.  She is currently not having pain in the knee other than when she touches the area.  With some continued soreness she wanted to have it evaluated. Notes that she is fully ambulatory.    History reviewed. No pertinent past medical history.  There are no problems to display for this patient.   Past Surgical History:  Procedure Laterality Date  . BREAST CYST EXCISION    . CHOLECYSTECTOMY    . ECTOPIC PREGNANCY SURGERY    . FISSURECTOMY    . TUBAL LIGATION      Allergies Patient has no known allergies.  History reviewed. No pertinent family history.  Social History Social History   Tobacco Use  . Smoking status: Never Smoker  . Smokeless tobacco: Never Used  Vaping Use  . Vaping Use: Never used  Substance Use Topics  . Alcohol use: Yes    Comment: occ  . Drug use: No    Review of Systems  Constitutional: No fever/chills Cardiovascular: Denies chest pain. Respiratory: Denies shortness of breath. Gastrointestinal: No abdominal pain.   Musculoskeletal: Negative for back pain. Positive right knee pain.  Skin: Negative for  rash. Neurological: Negative for headaches.   ____________________________________________   PHYSICAL EXAM:  VITAL SIGNS: ED Triage Vitals  Enc Vitals Group     BP 10/25/20 0858 131/85     Pulse Rate 10/25/20 0858 74     Resp 10/25/20 0858 18     Temp 10/25/20 0858 98.1 F (36.7 C)     Temp Source 10/25/20 0858 Oral     SpO2 10/25/20 0858 100 %     Weight 10/25/20 0859 165 lb (74.8 kg)     Height 10/25/20 0859 5\' 6"  (1.676 m)   Constitutional: Alert and oriented. Well appearing and in no acute distress. Eyes: Conjunctivae are normal.  Head: Atraumatic. Nose: No congestion/rhinnorhea. Mouth/Throat: Mucous membranes are moist.   Neck: No stridor.  Cardiovascular: Good peripheral circulation.  Respiratory: Normal respiratory effort.  Gastrointestinal: No distention.  Musculoskeletal: Minimal edema of the right leg with normal range of motion.  The patella is midline.  There is no bruising or popliteal tenderness.  Neurologic:  Normal speech and language. No gross focal neurologic deficits are appreciated.  Skin:  Skin is warm, dry and intact. No rash noted.  ____________________________________________  RADIOLOGY  Knee x-ray reviewed.  ____________________________________________   PROCEDURES  Procedure(s) performed:   Procedures  None  ____________________________________________   INITIAL IMPRESSION / ASSESSMENT AND PLAN / ED COURSE  Pertinent labs & imaging results that were available during my care of the patient were reviewed by  me and considered in my medical decision making (see chart for details).   Patient presents emergency department with knee pain after injuring it at work yesterday.  Based on her description it sounds like she may have had a patellar dislocation which she relocated and felt improved.  Her knee has full range of motion currently but some mild soreness both medially and laterally.  Based on her description and my exam my suspicion for  full knee dislocation requiring vascular imaging is exceedingly low. Plan for palin films and if negative plan for RICE and sports med/PCP follow up moving forward.   Plain films reviewed. No fracture or dislocation. Plan for follow up as above.  ____________________________________________  FINAL CLINICAL IMPRESSION(S) / ED DIAGNOSES  Final diagnoses:  Acute pain of right knee    NEW OUTPATIENT MEDICATIONS STARTED DURING THIS VISIT:  Discharge Medication List as of 10/25/2020  9:54 AM    START taking these medications   Details  diclofenac Sodium (VOLTAREN) 1 % GEL Apply 2 g topically 4 (four) times daily as needed., Starting Tue 10/25/2020, Normal        Note:  This document was prepared using Dragon voice recognition software and may include unintentional dictation errors.  Nanda Quinton, MD, [facility]    Harsh Trulock, [facility], MD 10/28/20 [phone]

## 2020-10-25 NOTE — ED Triage Notes (Signed)
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

## 2021-09-24 IMAGING — CR DG KNEE COMPLETE 4+V*R*
4 series · 4 of 4 positions shown · non-contrast
Comparison: Right knee series 12/18/2018.

CLINICAL DATA: 55-year-old female with recurrent patellar
dislocations.

EXAM:
RIGHT KNEE - COMPLETE 4+ VIEW

[t knee ap right]
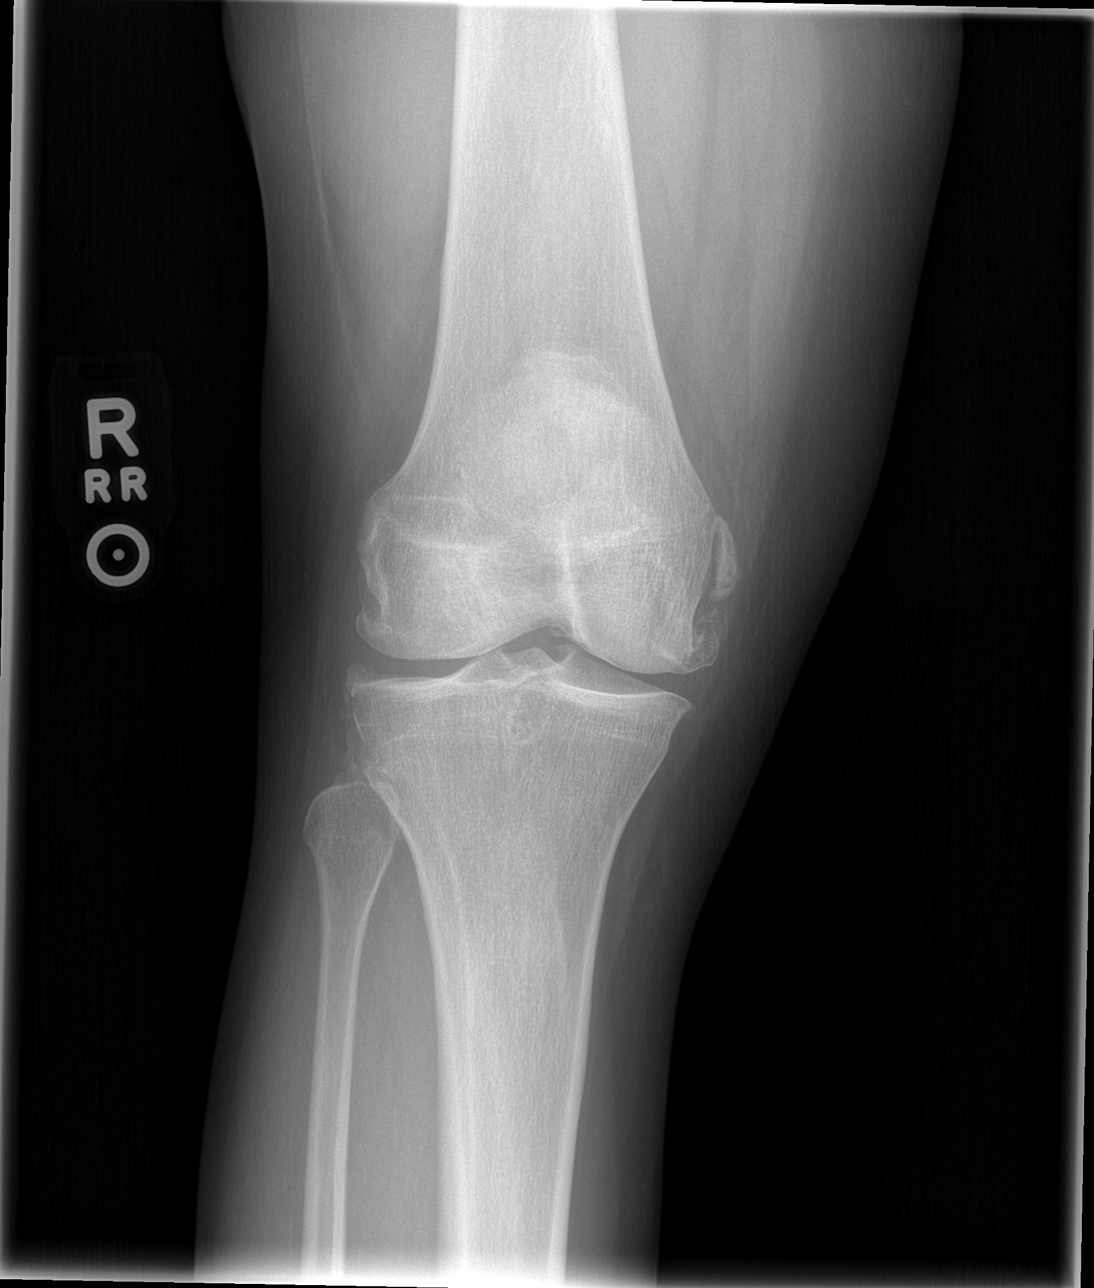

[t knee oblique right (1 of 2)]
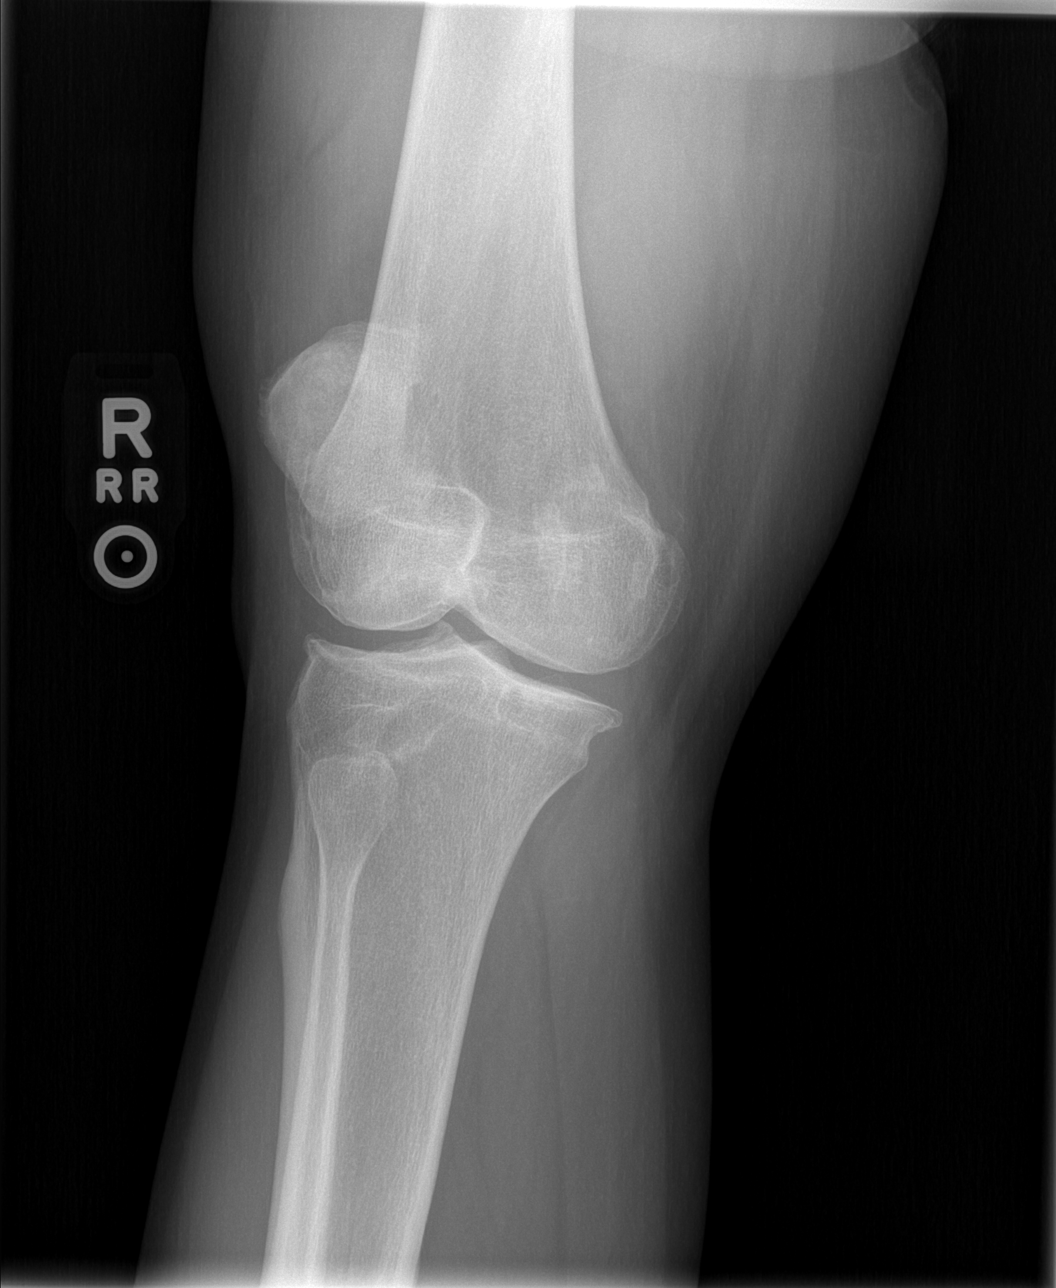

[t knee oblique right (2 of 2)]
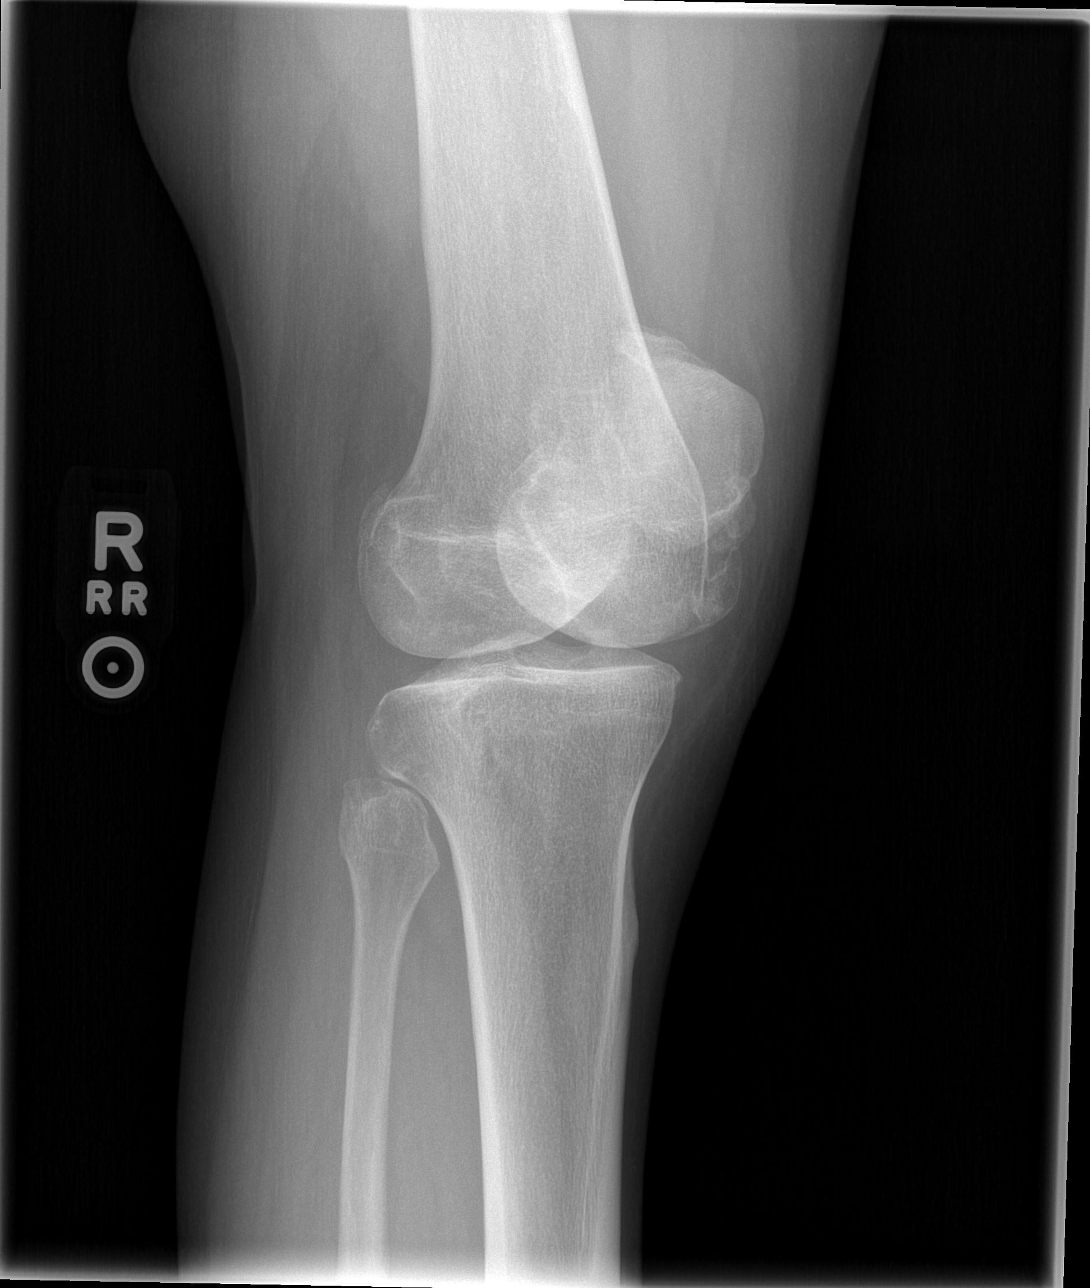

[t knee lat right]
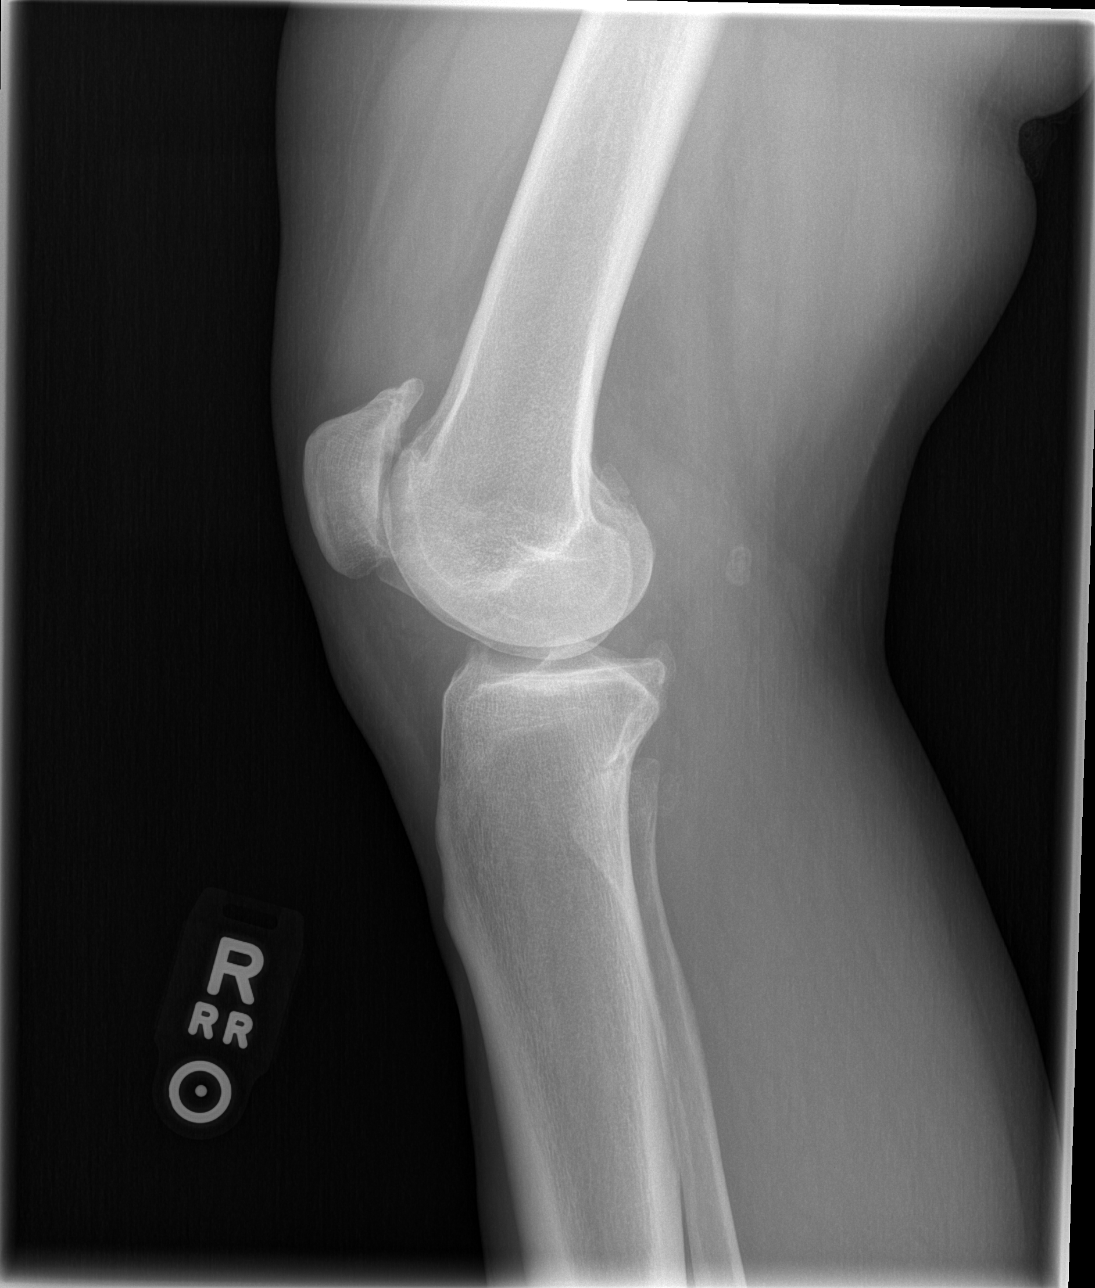

[4 of 4 positions shown; findings below may reference images not displayed]

FINDINGS: The patella appears intact and normally located. Bulky, age advanced
degenerative spurring at the right knee most pronounced at the
patellofemoral and medial compartments. Bulky new but chronic
appearing ossific fragment at the medial femoral condyle likely
related to chronic MCL pathology. No joint effusion identified. No
acute osseous abnormality identified.
IMPRESSION: 1.  No acute osseous abnormality identified.
2. Advanced degenerative changes at the right knee with evidence of
progressed MCL pathology since [DATE].

## 2021-12-13 ENCOUNTER — Encounter (HOSPITAL_BASED_OUTPATIENT_CLINIC_OR_DEPARTMENT_OTHER): Payer: Self-pay

## 2021-12-13 ENCOUNTER — Emergency Department (HOSPITAL_BASED_OUTPATIENT_CLINIC_OR_DEPARTMENT_OTHER)
Admission: EM | Admit: 2021-12-13 | Discharge: 2021-12-13 | Disposition: A | Discharge status: Home / Self Care | Payer: Self-pay | Attending: Emergency Medicine | Admitting: Emergency Medicine

## 2021-12-13 ENCOUNTER — Other Ambulatory Visit: Payer: Self-pay

## 2021-12-13 DIAGNOSIS — Z20822 Contact with and (suspected) exposure to covid-19: Secondary | ICD-10-CM | POA: Insufficient documentation

## 2021-12-13 DIAGNOSIS — A084 Viral intestinal infection, unspecified: Secondary | ICD-10-CM | POA: Insufficient documentation

## 2021-12-13 DIAGNOSIS — R6883 Chills (without fever): Secondary | ICD-10-CM | POA: Insufficient documentation

## 2021-12-13 LAB — CBC
HCT: 41.4 % (ref 36.0–46.0)
Hemoglobin: 13.7 g/dL (ref 12.0–15.0)
MCH: 28.3 pg (ref 26.0–34.0)
MCHC: 33.1 g/dL (ref 30.0–36.0)
MCV: 85.5 fL (ref 80.0–100.0)
Platelets: 337 10*3/uL (ref 150–400)
RBC: 4.84 MIL/uL (ref 3.87–5.11)
RDW: 14.6 % (ref 11.5–15.5)
WBC: 10 10*3/uL (ref 4.0–10.5)
nRBC: 0 % (ref 0.0–0.2)

## 2021-12-13 LAB — URINALYSIS, ROUTINE W REFLEX MICROSCOPIC
Bilirubin Urine: NEGATIVE
Glucose, UA: NEGATIVE mg/dL
Ketones, ur: NEGATIVE mg/dL
Leukocytes,Ua: NEGATIVE
Nitrite: NEGATIVE
Protein, ur: 100 mg/dL — AB
Specific Gravity, Urine: 1.025 (ref 1.005–1.030)
pH: 7 (ref 5.0–8.0)

## 2021-12-13 LAB — COMPREHENSIVE METABOLIC PANEL
ALT: 17 U/L (ref 0–44)
AST: 28 U/L (ref 15–41)
Albumin: 4.6 g/dL (ref 3.5–5.0)
Alkaline Phosphatase: 63 U/L (ref 38–126)
Anion gap: 9 (ref 5–15)
BUN: 14 mg/dL (ref 6–20)
CO2: 25 mmol/L (ref 22–32)
Calcium: 9.3 mg/dL (ref 8.9–10.3)
Chloride: 104 mmol/L (ref 98–111)
Creatinine, Ser: 1.11 mg/dL — ABNORMAL HIGH (ref 0.44–1.00)
GFR, Estimated: 59 mL/min — ABNORMAL LOW (ref >60)
Glucose, Bld: 123 mg/dL — ABNORMAL HIGH (ref 70–99)
Potassium: 4.3 mmol/L (ref 3.5–5.1)
Sodium: 138 mmol/L (ref 135–145)
Total Bilirubin: 0.7 mg/dL (ref 0.3–1.2)
Total Protein: 8.1 g/dL (ref 6.5–8.1)

## 2021-12-13 LAB — URINALYSIS, MICROSCOPIC (REFLEX)

## 2021-12-13 LAB — PREGNANCY, URINE: Preg Test, Ur: NEGATIVE

## 2021-12-13 LAB — LIPASE, BLOOD: Lipase: 22 U/L (ref 11–51)

## 2021-12-13 LAB — SARS CORONAVIRUS 2 BY RT PCR: SARS Coronavirus 2 by RT PCR: NEGATIVE

## 2021-12-13 MED ORDER — ONDANSETRON HCL 4 MG/2ML IJ SOLN
4.0000 mg | Freq: Once | INTRAMUSCULAR | Status: AC
  Administered 2021-12-13: 4 mg via INTRAVENOUS
  Filled 2021-12-13: qty 2

## 2021-12-13 MED ORDER — SODIUM CHLORIDE 0.9 % IV BOLUS
1000.0000 mL | Freq: Once | INTRAVENOUS | Status: AC
  Administered 2021-12-13: 1000 mL via INTRAVENOUS

## 2021-12-13 MED ORDER — KETOROLAC TROMETHAMINE 15 MG/ML IJ SOLN
15.0000 mg | Freq: Once | INTRAMUSCULAR | Status: AC
  Administered 2021-12-13: 15 mg via INTRAVENOUS
  Filled 2021-12-13: qty 1

## 2021-12-13 MED ORDER — ONDANSETRON HCL 4 MG PO TABS: 4.0000 mg | ORAL_TABLET | Freq: Four times a day (QID) | ORAL | 0 refills | Status: AC

## 2021-12-13 MED ORDER — OXYCODONE HCL 5 MG PO TABS
5.0000 mg | ORAL_TABLET | ORAL | Status: AC
  Administered 2021-12-13: 5 mg via ORAL
  Filled 2021-12-13: qty 1

## 2021-12-13 NOTE — ED Provider Notes (Signed)
[facility]ENC[facility] DEPARTMENT Provider Note   CSN: [id number] Arrival date & time: 12/13/21  1749     History  Chief Complaint  Patient presents with   Headache   Emesis   Chills   Diarrhea    Regina Spencer is a 56 y.o. female.  Patient is a 56 year old female presenting for nausea, vomiting, diarrhea, and headache that started yesterday after contact with her grandson who had the same symptoms.  Patient states she was sharing food with her grandson prior to symptom onset.  Denies any fevers or chills.  Admits to minimal abdominal pain.  No neck stiffness or neck rigidity.  The history is provided by the patient. No language interpreter was used.  Headache Associated symptoms: diarrhea, nausea and vomiting   Associated symptoms: no abdominal pain, no back pain, no cough, no ear pain, no eye pain, no fever, no seizures and no sore throat   Emesis Associated symptoms: diarrhea and headaches   Associated symptoms: no abdominal pain, no arthralgias, no chills, no cough, no fever and no sore throat   Diarrhea Associated symptoms: headaches and vomiting   Associated symptoms: no abdominal pain, no arthralgias, no chills and no fever        Home Medications Prior to Admission medications   Medication Sig Start Date End Date Taking? Authorizing Provider  ondansetron (ZOFRAN) 4 MG tablet Take 1 tablet (4 mg total) by mouth every 6 (six) hours. 9/38/18  Yes Campbell Stall P, DO  cyclobenzaprine (FLEXERIL) 10 MG tablet Take 1 tablet (10 mg total) by mouth 2 (two) times daily as needed for muscle spasms. 01/04/20   Malvin Johns, MD  diclofenac Sodium (VOLTAREN) 1 % GEL Apply 2 g topically 4 (four) times daily as needed. 10/25/20   Long, Wonda Olds, MD  naproxen (NAPROSYN) 500 MG tablet Take 1 tablet (500 mg total) by mouth 2 (two) times daily as needed for mild pain or moderate pain. 12/18/18   Ward, Ozella Almond, PA-C  predniSONE (DELTASONE) 20 MG tablet 3 tabs po day  one, then 2 po daily x 4 days 01/04/20   Malvin Johns, MD  traMADol (ULTRAM) 50 MG tablet Take 1 tablet (50 mg total) by mouth every 6 (six) hours as needed for pain. 04/05/13   Tanna Furry, MD      Allergies    Patient has no known allergies.    Review of Systems   Review of Systems  Constitutional:  Negative for chills and fever.  HENT:  Negative for ear pain and sore throat.   Eyes:  Negative for pain and visual disturbance.  Respiratory:  Negative for cough and shortness of breath.   Cardiovascular:  Negative for chest pain and palpitations.  Gastrointestinal:  Positive for diarrhea, nausea and vomiting. Negative for abdominal pain.  Genitourinary:  Negative for dysuria and hematuria.  Musculoskeletal:  Negative for arthralgias and back pain.  Skin:  Negative for color change and rash.  Neurological:  Positive for headaches. Negative for seizures and syncope.  All other systems reviewed and are negative.   Physical Exam Updated Vital Signs BP (!) 125/98 (BP Location: Left Arm)   Pulse (!) 118   Temp 98.6 F (37 C) (Oral)   Resp 18   Ht '5\' 6"'$  (1.676 m)   Wt 77.1 kg   SpO2 95%   BMI 27.44 kg/m  Physical Exam Vitals and nursing note reviewed.  Constitutional:      General: She is not in acute distress.  Appearance: She is well-developed.  HENT:     Head: Normocephalic and atraumatic.  Eyes:     Conjunctiva/sclera: Conjunctivae normal.  Cardiovascular:     Rate and Rhythm: Normal rate and regular rhythm.     Heart sounds: No murmur heard. Pulmonary:     Effort: Pulmonary effort is normal. No respiratory distress.     Breath sounds: Normal breath sounds.  Abdominal:     Palpations: Abdomen is soft.     Tenderness: There is no abdominal tenderness.  Musculoskeletal:        General: No swelling.     Cervical back: Neck supple.  Skin:    General: Skin is warm and dry.     Capillary Refill: Capillary refill takes less than 2 seconds.  Neurological:     Mental  Status: She is alert.     GCS: GCS eye subscore is 4. GCS verbal subscore is 5. GCS motor subscore is 6.  Psychiatric:        Mood and Affect: Mood normal.     ED Results / Procedures / Treatments   Labs (all labs ordered are listed, but only abnormal results are displayed) Labs Reviewed  COMPREHENSIVE METABOLIC PANEL - Abnormal; Notable for the following components:      Result Value   Glucose, Bld 123 (*)    Creatinine, Ser 1.11 (*)    GFR, Estimated 59 (*)    All other components within normal limits  URINALYSIS, ROUTINE W REFLEX MICROSCOPIC - Abnormal; Notable for the following components:   Color, Urine AMBER (*)    Hgb urine dipstick TRACE (*)    Protein, ur 100 (*)    All other components within normal limits  URINALYSIS, MICROSCOPIC (REFLEX) - Abnormal; Notable for the following components:   Bacteria, UA RARE (*)    All other components within normal limits  SARS CORONAVIRUS 2 BY RT PCR  LIPASE, BLOOD  CBC  PREGNANCY, URINE    EKG None  Radiology No results found.  Procedures Procedures    Medications Ordered in ED Medications  sodium chloride 0.9 % bolus 1,000 mL (0 mLs Intravenous Stopped 12/13/21 2117)  ondansetron (ZOFRAN) injection 4 mg (4 mg Intravenous Given 12/13/21 2006)  ketorolac (TORADOL) 15 MG/ML injection 15 mg (15 mg Intravenous Given 12/13/21 2007)  oxyCODONE (Oxy IR/ROXICODONE) immediate release tablet 5 mg (5 mg Oral Given 12/13/21 2105)    ED Course/ Medical Decision Making/ A&P                           Medical Decision Making Amount and/or Complexity of Data Reviewed Labs: ordered.  Risk Prescription drug management.   31:22 PM 56 year old female presenting for nausea, vomiting, diarrhea, and headache that started yesterday after contact with her grandson who had the same symptoms.  Patient is alert and oriented x3, no acute distress, afebrile, stable vital signs.  Abdomen is soft and nontender.  Patient ate off of food from her  grandchild who had the similar symptoms prior to onset of her symptoms.  Likely viral gastroenteritis.  Patient given IV fluids, Zofran, Toradol for symptomatic management.  On physical exam the abdomen is soft and nontender.  Doubt acute life-threatening intra-abdominal pathology.  Stable laboratory studies including liver profile, lipase, and renal function.    Patient in no distress and overall condition improved here in the ED. Detailed discussions were had with the patient regarding current findings, and need for close f/u  with PCP or on call doctor. The patient has been instructed to return immediately if the symptoms worsen in any way for re-evaluation. Patient verbalized understanding and is in agreement with current care plan. All questions answered prior to discharge.         Final Clinical Impression(s) / ED Diagnoses Final diagnoses:  Viral gastroenteritis    Rx / DC Orders ED Discharge Orders          Ordered    ondansetron (ZOFRAN) 4 MG tablet  Every 6 hours        12/13/21 2133              Lianne Cure, DO 89/21/19 2134

## 2021-12-13 NOTE — Discharge Instructions (Signed)
Zofran sent to your pharmacy.  Please take 4 mg every 4-6 hours as needed for nausea and vomiting.  Please continue to stay hydrated and drink fluids.  Drink Pedialyte or Gatorade if you continue to have diarrhea.  Follow-up with your primary care physician in the next 3 days if symptoms do not resolve.

## 2021-12-13 NOTE — ED Notes (Signed)
Patient states that she is feeling much better and is ready to go home.  DO Regina Spencer made aware.

## 2021-12-13 NOTE — ED Triage Notes (Signed)
Pt c/o diarrhea, nausea, vomiting, and headache since last night
